# Patient Record
Sex: Male | Born: 1960 | Race: White | Hispanic: No | Marital: Married
Health system: Southern US, Community
[De-identification: ages and names within clinical notes are randomized; demographics above are authoritative.]

---

## 1999-10-06 ENCOUNTER — Encounter: Payer: Self-pay | Admitting: Emergency Medicine

## 1999-10-06 ENCOUNTER — Emergency Department (HOSPITAL_COMMUNITY): Admission: EM | Admit: 1999-10-06 | Discharge: 1999-10-06 | Payer: Self-pay | Admitting: Emergency Medicine

## 2002-06-10 ENCOUNTER — Encounter: Payer: Self-pay | Admitting: Family Medicine

## 2002-06-10 ENCOUNTER — Encounter: Admission: RE | Admit: 2002-06-10 | Discharge: 2002-06-10 | Payer: Self-pay | Admitting: Family Medicine

## 2012-03-01 ENCOUNTER — Other Ambulatory Visit: Payer: Self-pay | Admitting: Family Medicine

## 2012-03-01 DIAGNOSIS — R109 Unspecified abdominal pain: Secondary | ICD-10-CM

## 2012-03-08 ENCOUNTER — Ambulatory Visit
Admission: RE | Admit: 2012-03-08 | Discharge: 2012-03-08 | Disposition: A | Discharge status: Home / Self Care | Payer: BC Managed Care – PPO | Source: Ambulatory Visit | Attending: Family Medicine | Admitting: Family Medicine

## 2012-03-08 DIAGNOSIS — R109 Unspecified abdominal pain: Secondary | ICD-10-CM

## 2013-07-05 IMAGING — US US ABDOMEN LIMITED
1 series · 14 of 25 positions shown · non-contrast
Comparison: No prior images available.  Ultrasound and CT report
from 10/06/1999 are reviewed.

CLINICAL DATA: Right upper quadrant abdominal pain.  Hypertension
and high cholesterol.

LIMITED ABDOMINAL ULTRASOUND - RIGHT UPPER QUADRANT

[Series 1: us abdomen limited · 0.30mm/px · 14 of 38 slices shown]
[im 1/38]
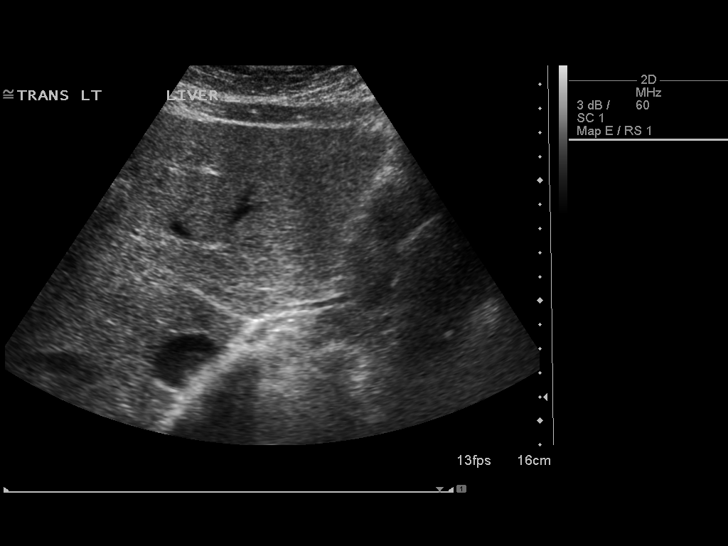
[im 4/38]
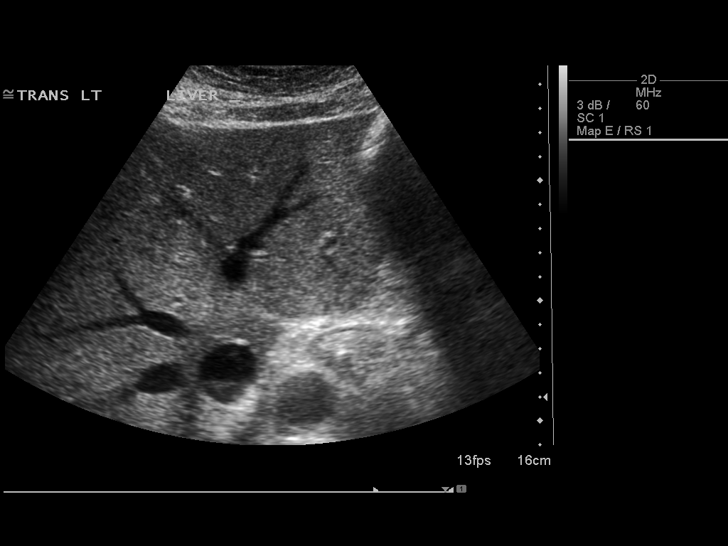
[im 7/38]
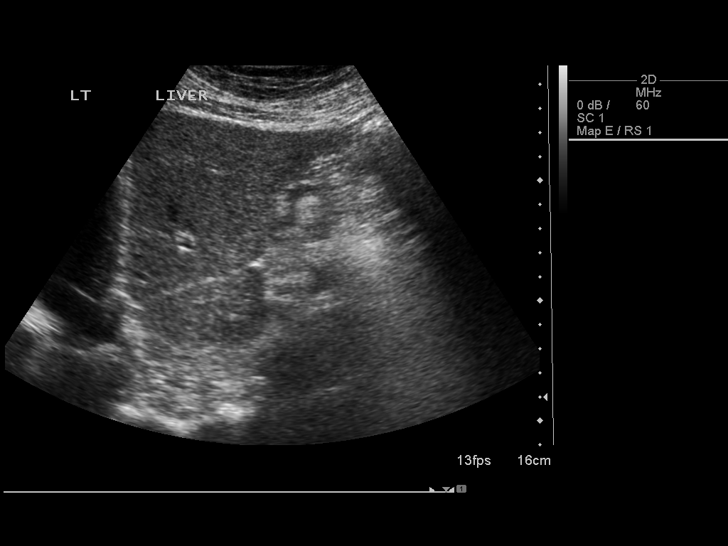
[im 10/38]
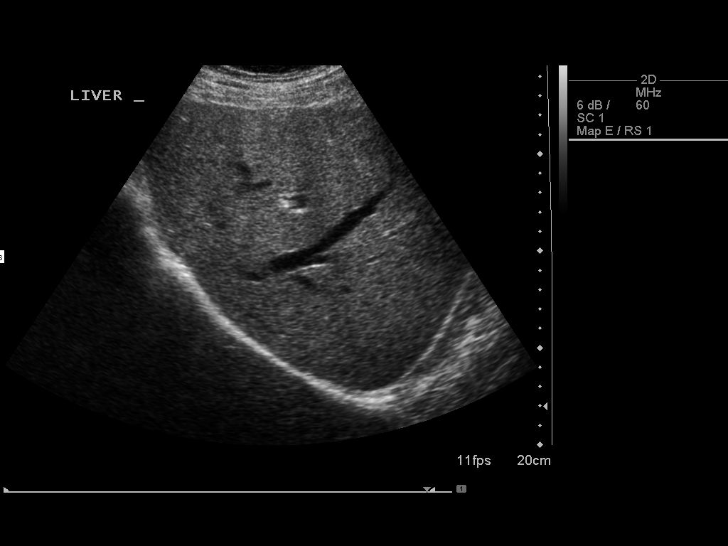
[im 13/38]
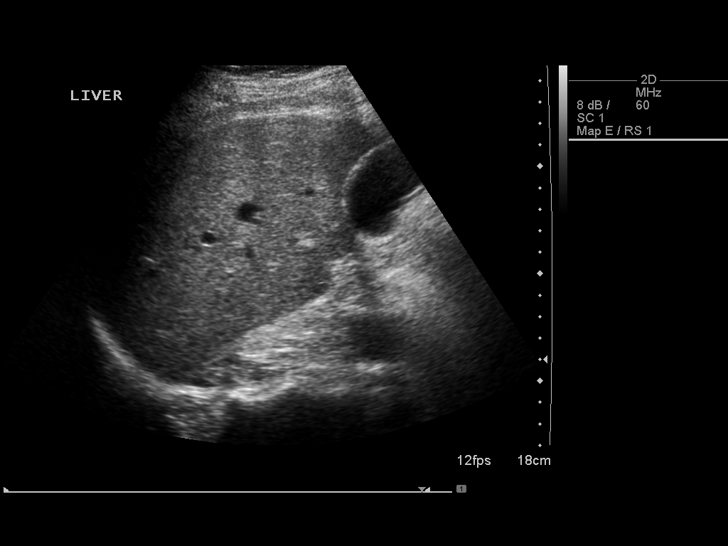
[im 14/38]
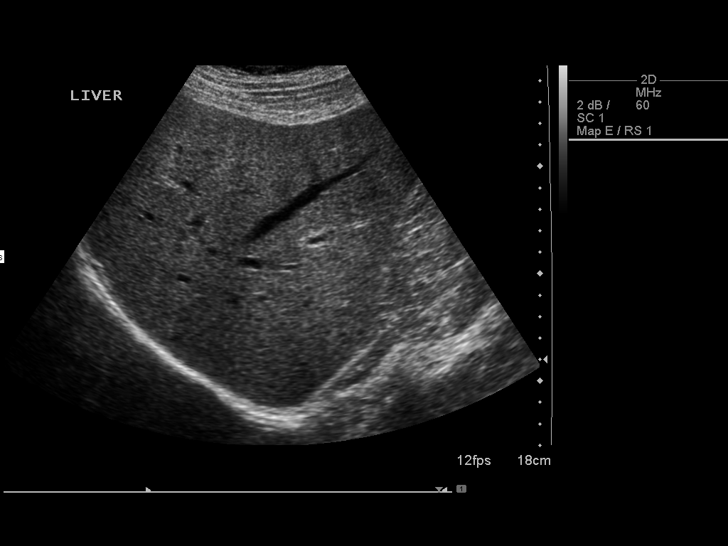
[im 17/38]
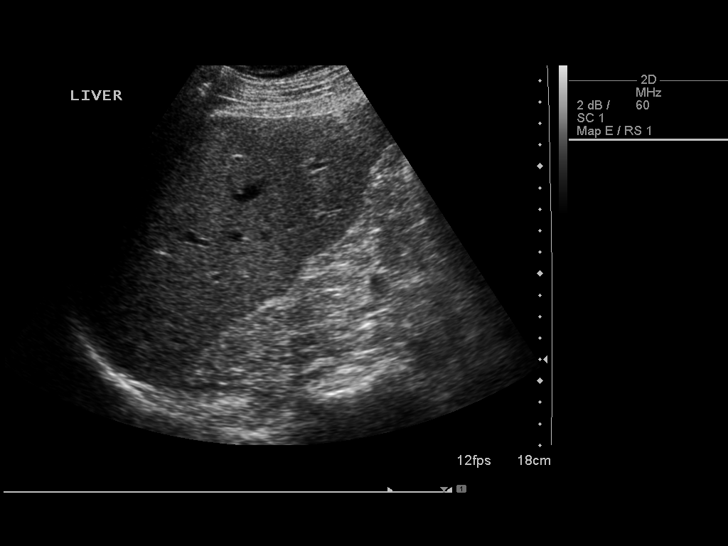
[im 21/38]
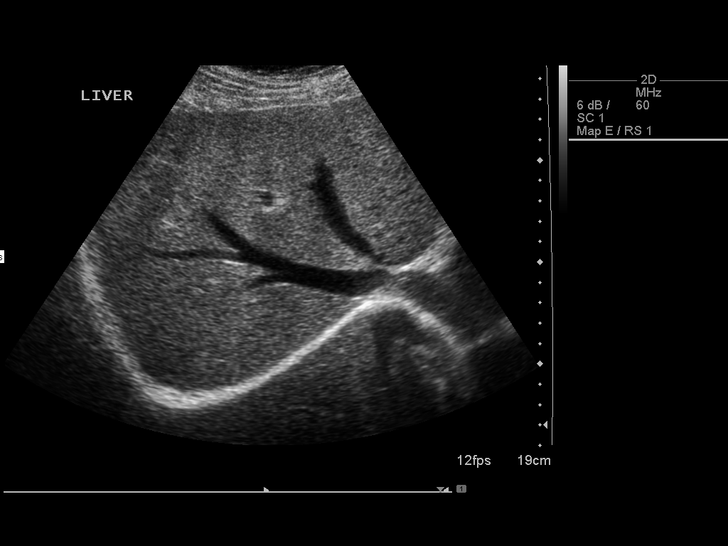
[im 24/38]
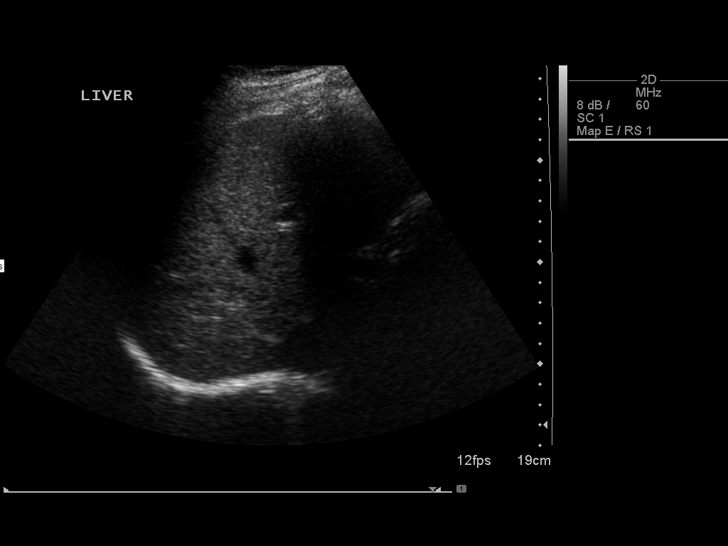
[im 25/38]
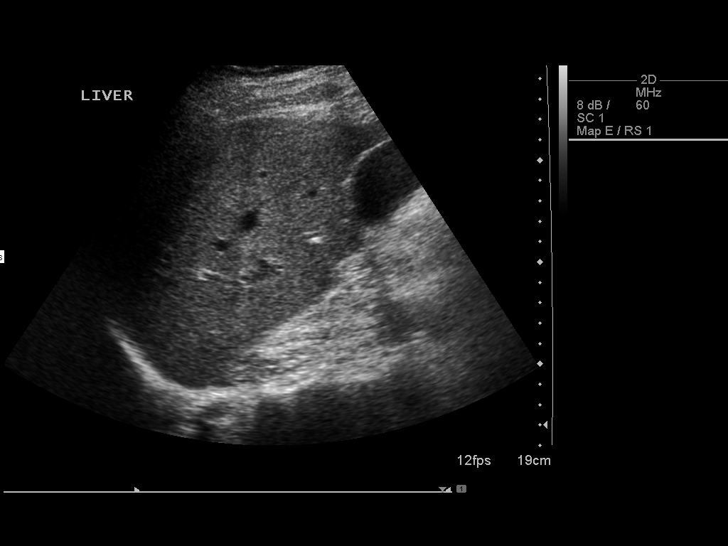
[im 28/38]
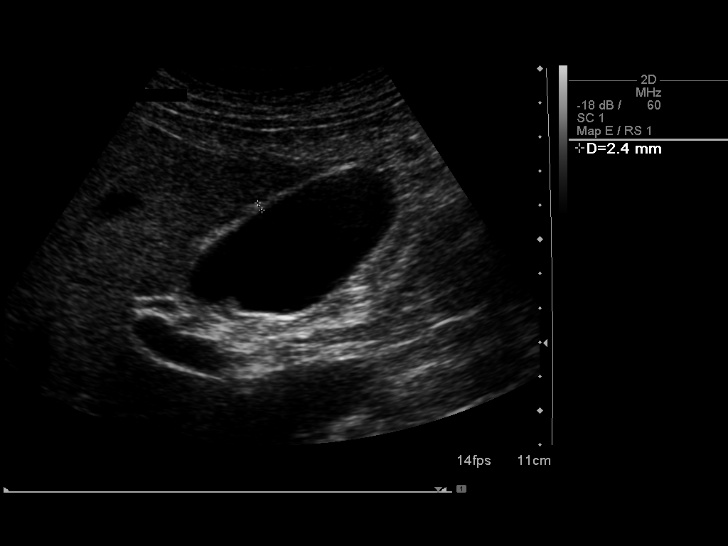
[im 31/38]
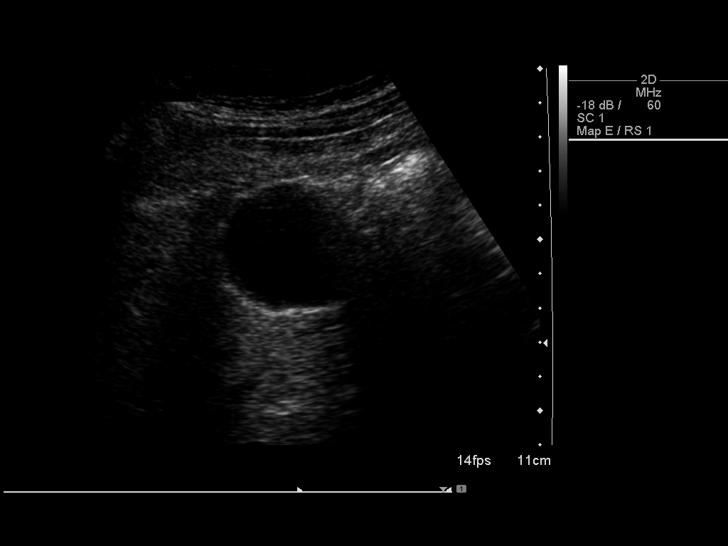
[im 34/38]
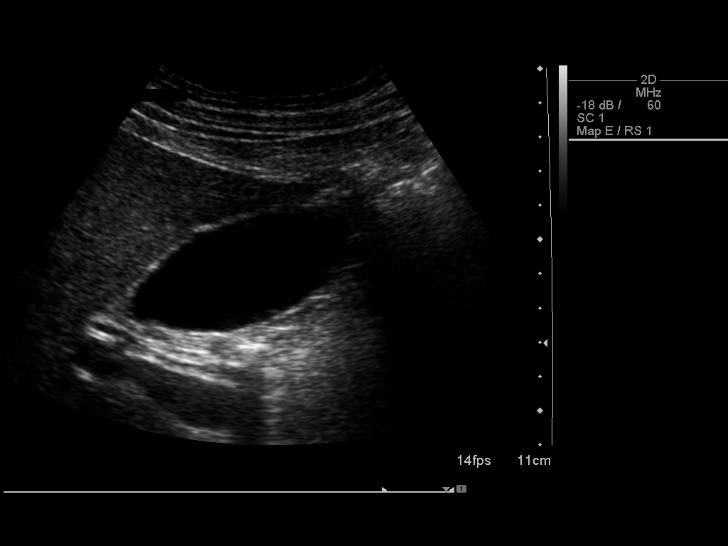
[im 38/38]
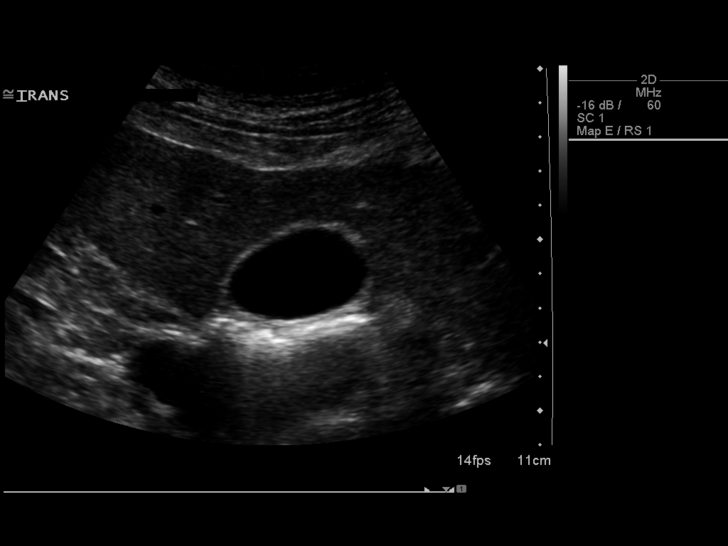

[14 of 25 positions shown; findings below may reference images not displayed]

FINDINGS: Gallbladder:  Normal, without wall thickening, stone, or
pericholecystic fluid.  Sonographic Murphy's sign was not elicited.

Common bile duct:  Normal, 5 mm.

Liver:  Normal in echogenicity, without focal lesion.
IMPRESSION: No acute process or explanation for right upper quadrant pain.

## 2013-08-30 ENCOUNTER — Other Ambulatory Visit: Payer: Self-pay | Admitting: Internal Medicine

## 2013-08-30 DIAGNOSIS — R319 Hematuria, unspecified: Secondary | ICD-10-CM

## 2013-09-02 ENCOUNTER — Other Ambulatory Visit: Payer: BC Managed Care – PPO

## 2013-09-06 ENCOUNTER — Ambulatory Visit
Admission: RE | Admit: 2013-09-06 | Discharge: 2013-09-06 | Disposition: A | Discharge status: Home / Self Care | Payer: BC Managed Care – PPO | Source: Ambulatory Visit | Attending: Internal Medicine | Admitting: Internal Medicine

## 2013-09-06 DIAGNOSIS — R319 Hematuria, unspecified: Secondary | ICD-10-CM

## 2015-01-03 IMAGING — CT CT ABD-PELV W/O CM
2 of 4 series · 13 of 36 positions shown, 18 images · non-contrast
Comparison: Right upper quadrant ultrasound 03/08/2012. Report of
the CT Abdomen and Pelvis 10/06/1999 (no images available).

CLINICAL DATA: 52-year-old male with hematuria and right flank
pain. Initial encounter.

EXAM:
CT ABDOMEN AND PELVIS WITHOUT CONTRAST
TECHNIQUE: Multidetector CT imaging of the abdomen and pelvis was performed
following the standard protocol without intravenous contrast.

[Series 601: coronal body · coronal · 0.92mm/px · 1 of 126 slices shown, 2 images]
[im 42/126  soft-tissue]
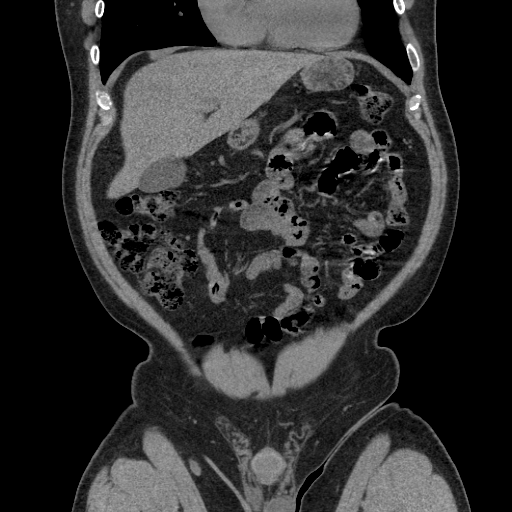
[im 42/126  bone]
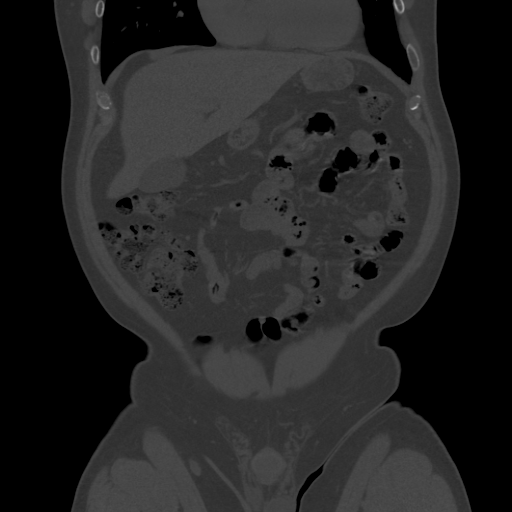

[Series 602: sagittal body · sagittal · 0.92mm/px · 12 of 163 slices shown, 16 images]
[im 10/163  soft-tissue]
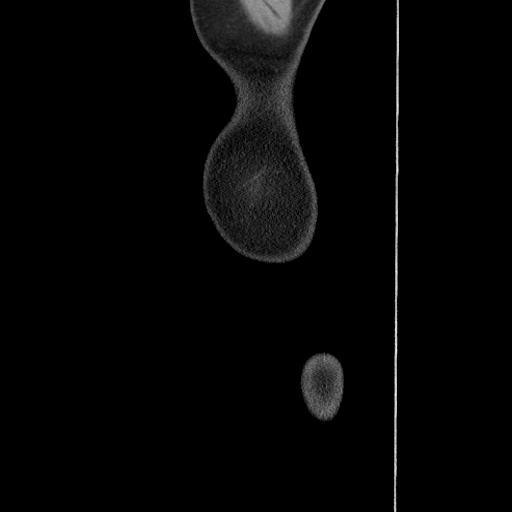
[im 10/163  lung]
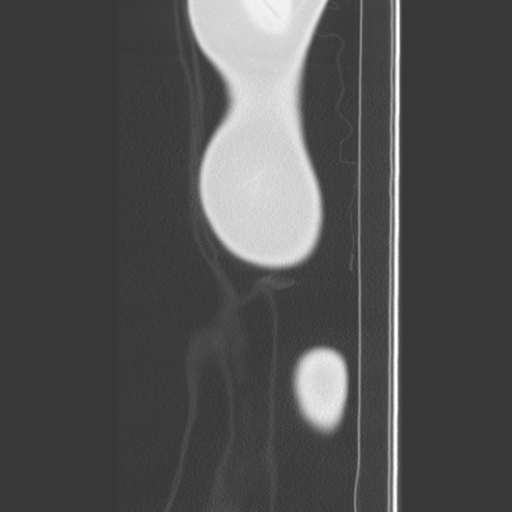
[im 10/163  bone]
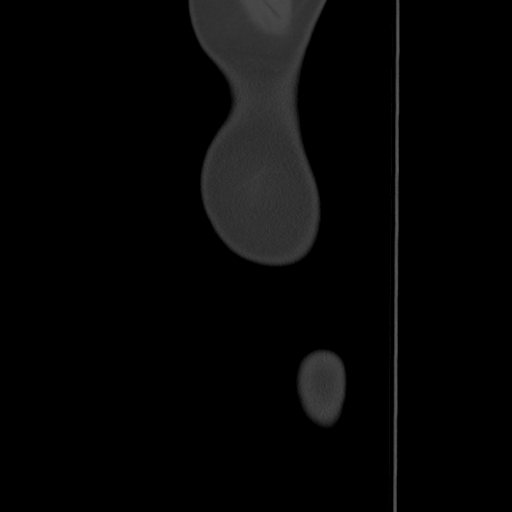
[im 19/163  lung]
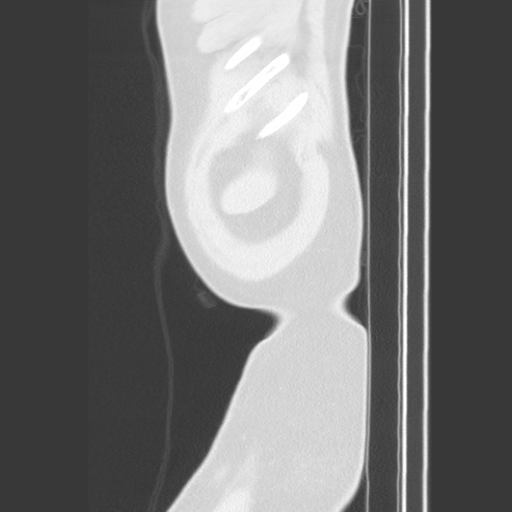
[im 28/163  soft-tissue]
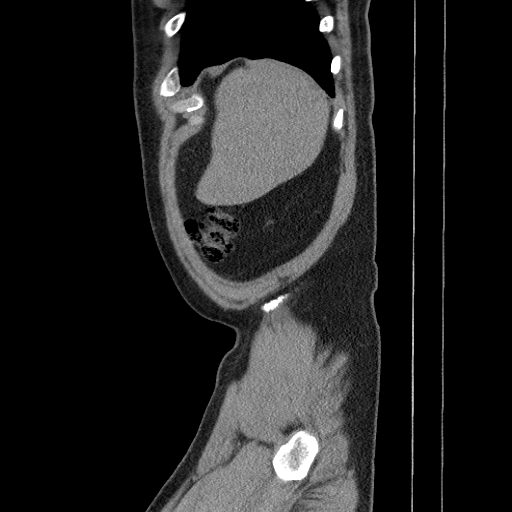
[im 28/163  lung]
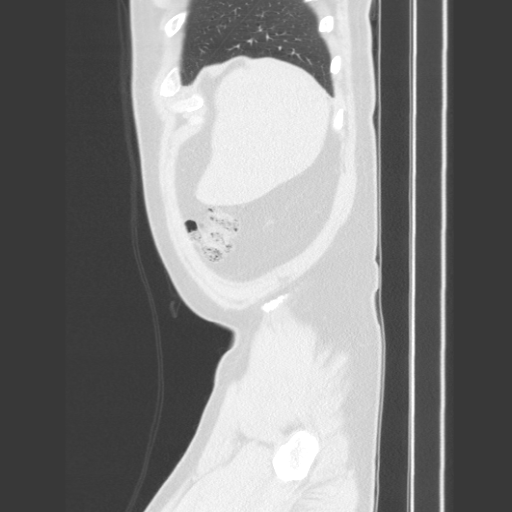
[im 37/163  lung]
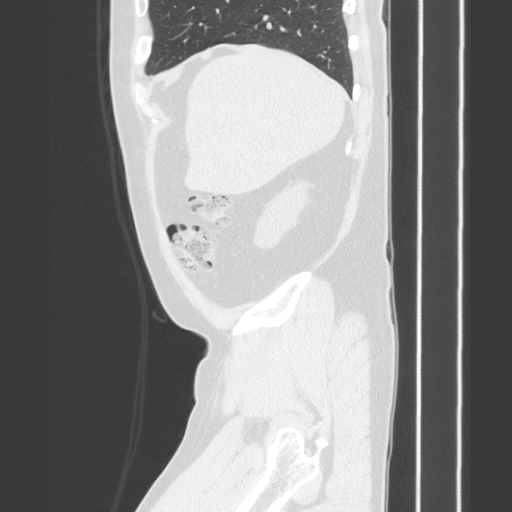
[im 46/163  soft-tissue]
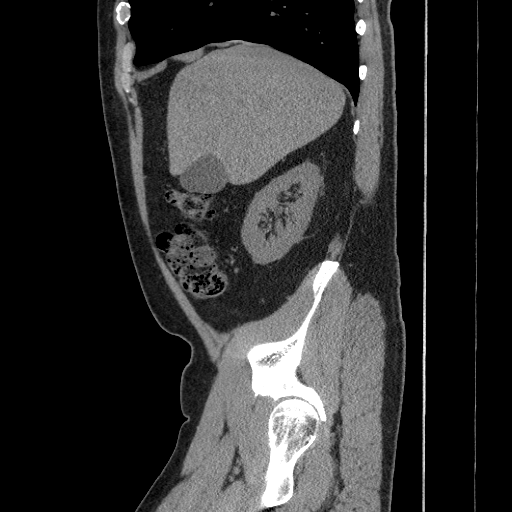
[im 55/163  soft-tissue]
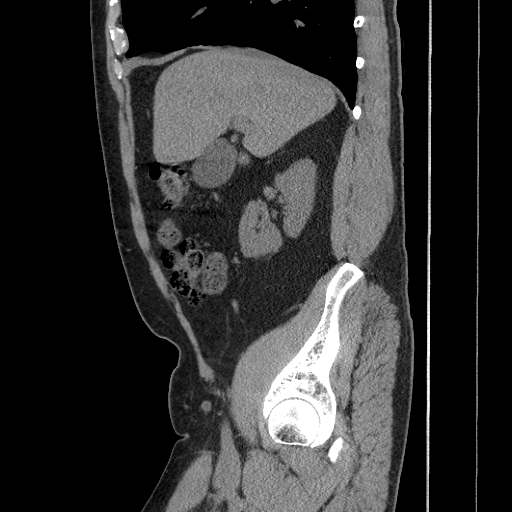
[im 73/163  soft-tissue]
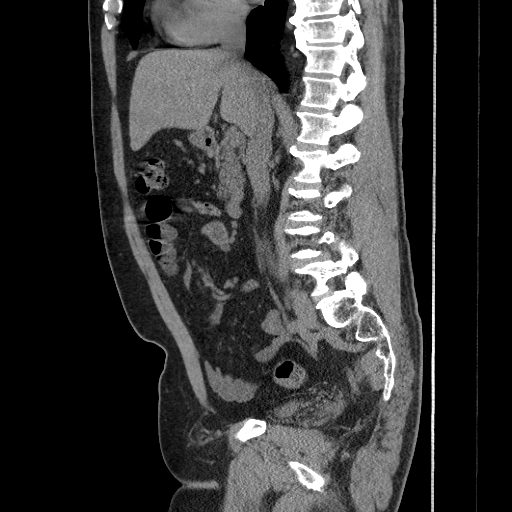
[im 91/163  soft-tissue]
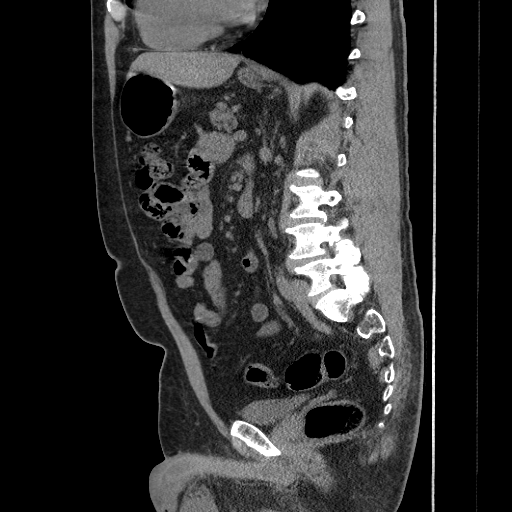
[im 109/163  soft-tissue]
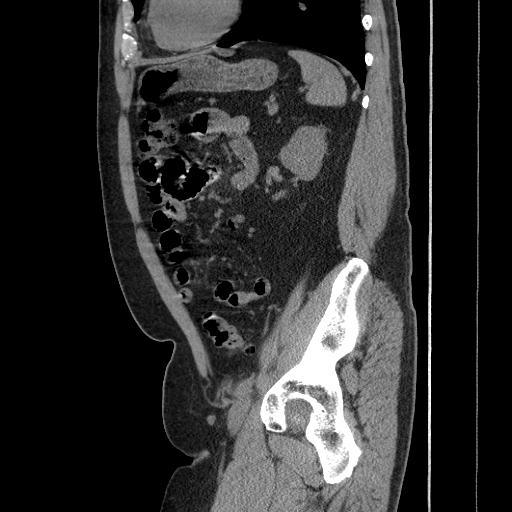
[im 118/163  soft-tissue]
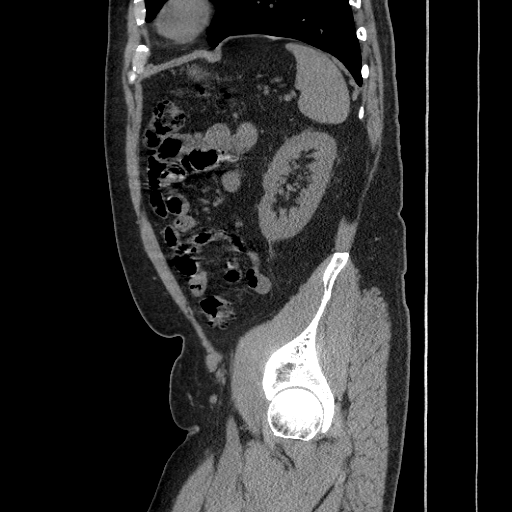
[im 136/163  soft-tissue]
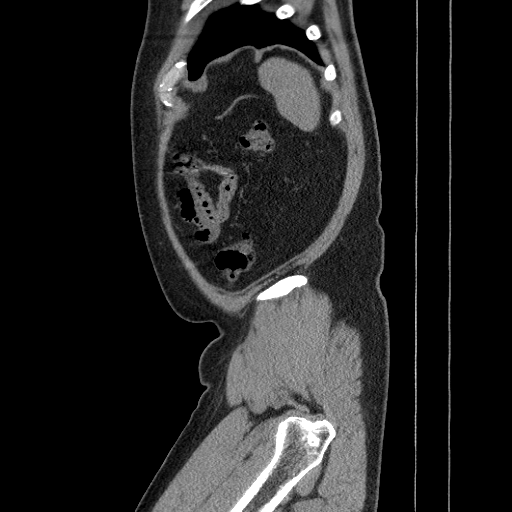
[im 136/163  bone]
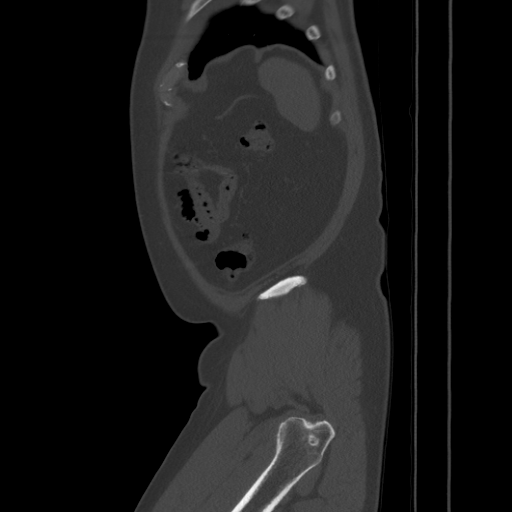
[im 154/163  soft-tissue]
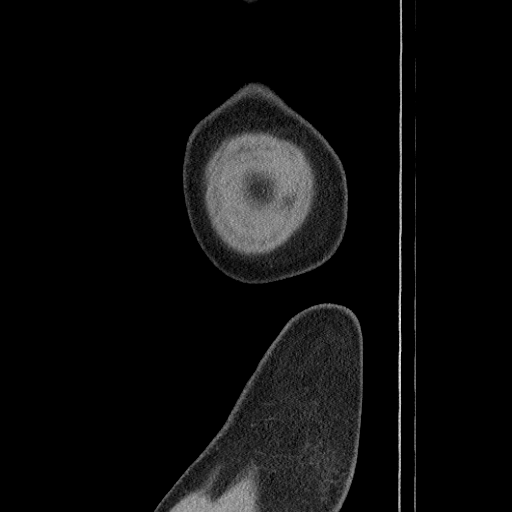

[13 of 36 positions shown; findings below may reference images not displayed]

FINDINGS: No pericardial or pleural effusion.  Negative lung bases.

Lower lumbar disc, endplate, and facet degeneration. Severe facet
arthropathy at L4-L5, with left side vacuum facet phenomena. No
acute osseous abnormality identified.

No pelvic free fluid. Negative rectum. Negative distal sigmoid
colon. Moderate diverticulosis of the proximal sigmoid. Negative
left colon and transverse colon. Negative right colon and appendix.
Negative terminal ileum. No dilated small bowel. Largely
decompressed stomach and duodenum.

Negative non contrast liver, spleen, pancreas and adrenal glands.

Small depending cholelithiasis. No pericholecystic fluid. Mild
haziness at the root of the small bowel mesenteries.

Negative non contrast left kidney and left ureter.

Mildly asymmetrically increased right perinephric stranding. No
dilatation of the right renal collecting system. No nephrolithiasis.
The proximal right ureter is normal. No definite dilatation of the
distal right ureter, but just inside the right aspect of the bladder
there is an angular 6 x 4 mm calculus. This projects very near the
right ureterovesical junction. The bladder is fairly decompressed
and otherwise unremarkable.

No abdominal free fluid. Mild Aortoiliac calcified atherosclerosis
noted. No lymphadenopathy identified.
IMPRESSION: 1. Urologic calculus, 6 x 4 mm, just inside the bladder near the
right UVJ, compatible with very recently passed ureteral calculus.
Mildly asymmetric right perinephric stranding, no hydronephrosis or
hydroureter.
2. No other urologic calculus identified.
3. Cholelithiasis. Sigmoid diverticulosis. Lower lumbar disc and
facet degeneration.

## 2015-08-06 ENCOUNTER — Other Ambulatory Visit: Payer: Self-pay | Admitting: Family Medicine

## 2015-08-06 DIAGNOSIS — E049 Nontoxic goiter, unspecified: Secondary | ICD-10-CM

## 2015-08-10 ENCOUNTER — Ambulatory Visit
Admission: RE | Admit: 2015-08-10 | Discharge: 2015-08-10 | Disposition: A | Discharge status: Home / Self Care | Payer: BLUE CROSS/BLUE SHIELD | Source: Ambulatory Visit | Attending: Family Medicine | Admitting: Family Medicine

## 2015-08-10 DIAGNOSIS — E049 Nontoxic goiter, unspecified: Secondary | ICD-10-CM

## 2015-08-15 ENCOUNTER — Other Ambulatory Visit: Payer: Self-pay | Admitting: Family Medicine

## 2015-08-15 DIAGNOSIS — E041 Nontoxic single thyroid nodule: Secondary | ICD-10-CM

## 2015-08-23 ENCOUNTER — Ambulatory Visit
Admission: RE | Admit: 2015-08-23 | Discharge: 2015-08-23 | Disposition: A | Discharge status: Home / Self Care | Payer: BLUE CROSS/BLUE SHIELD | Source: Ambulatory Visit | Attending: Family Medicine | Admitting: Family Medicine

## 2015-08-23 ENCOUNTER — Other Ambulatory Visit (HOSPITAL_COMMUNITY)
Admission: RE | Admit: 2015-08-23 | Discharge: 2015-08-23 | Disposition: A | Discharge status: Home / Self Care | Payer: BLUE CROSS/BLUE SHIELD | Source: Ambulatory Visit | Attending: Diagnostic Radiology | Admitting: Diagnostic Radiology

## 2015-08-23 DIAGNOSIS — E041 Nontoxic single thyroid nodule: Secondary | ICD-10-CM | POA: Insufficient documentation

## 2016-12-06 IMAGING — US US SOFT TISSUE HEAD/NECK
1 series · 13 of 25 positions shown · non-contrast
Comparison: CT 04/16/2007

CLINICAL DATA: Goiter

EXAM:
THYROID ULTRASOUND
TECHNIQUE: Ultrasound examination of the thyroid gland and adjacent soft
tissues was performed.

[Series 1: us soft tissue head/neck · 0.11mm/px · 13 of 69 slices shown]
[im 1/69]
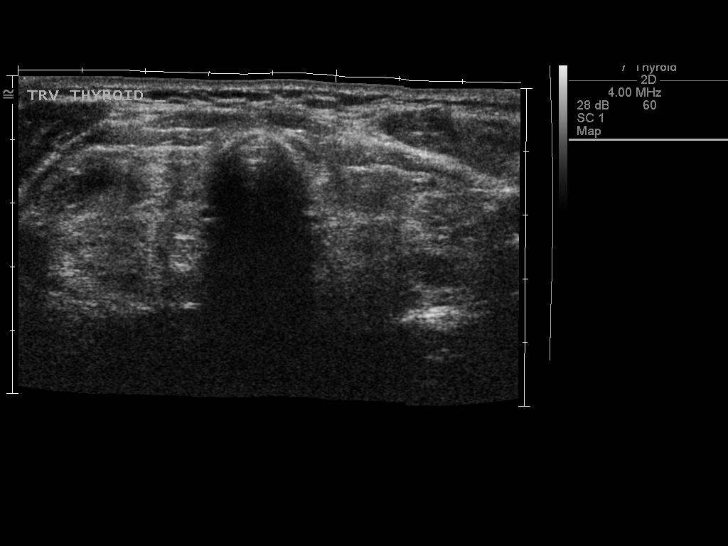
[im 6/69]
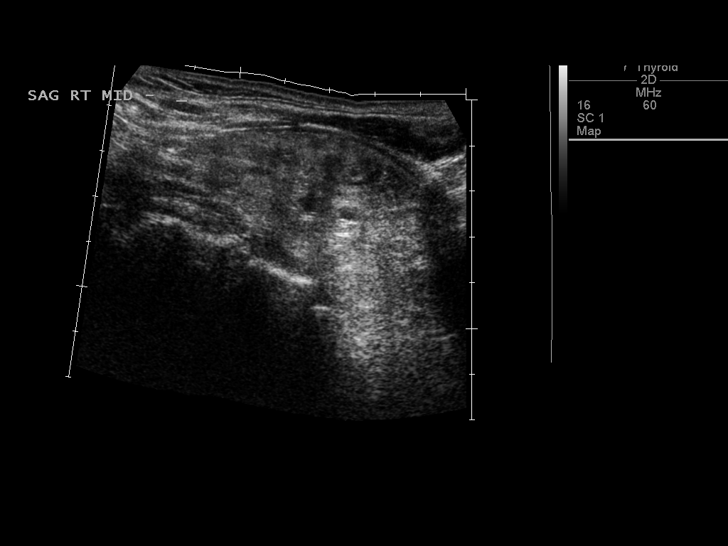
[im 12/69]
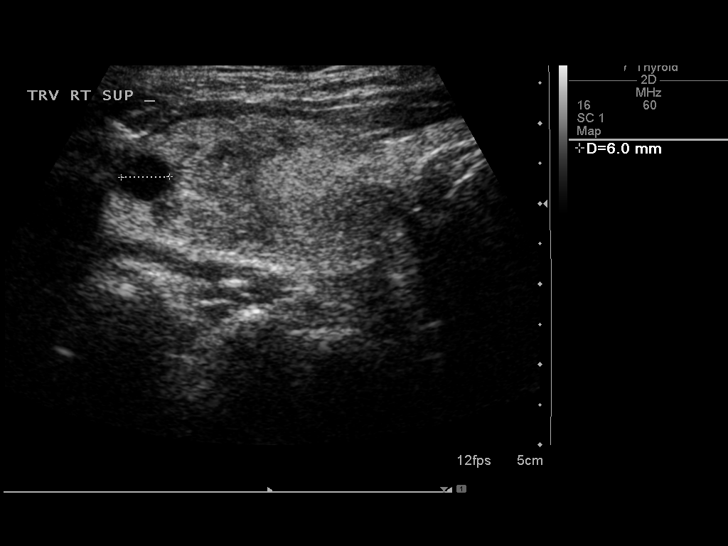
[im 18/69]
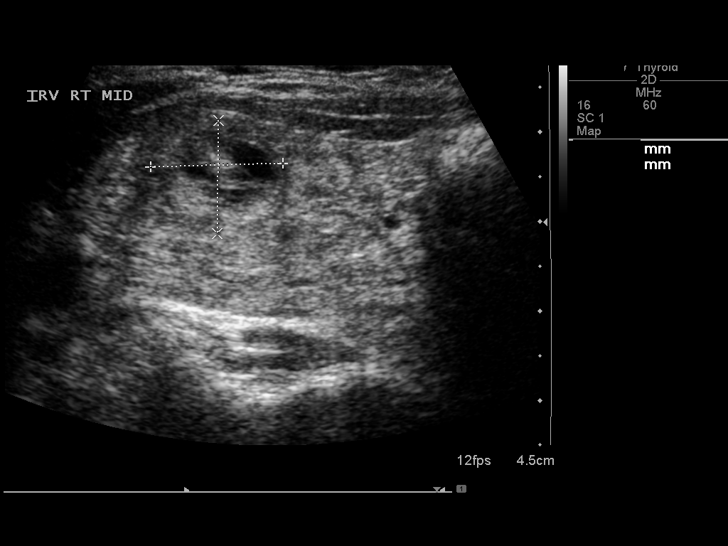
[im 23/69]
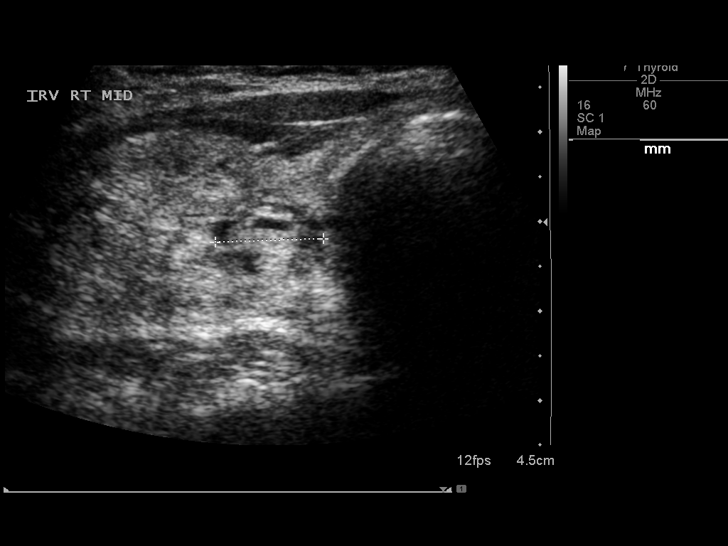
[im 29/69]
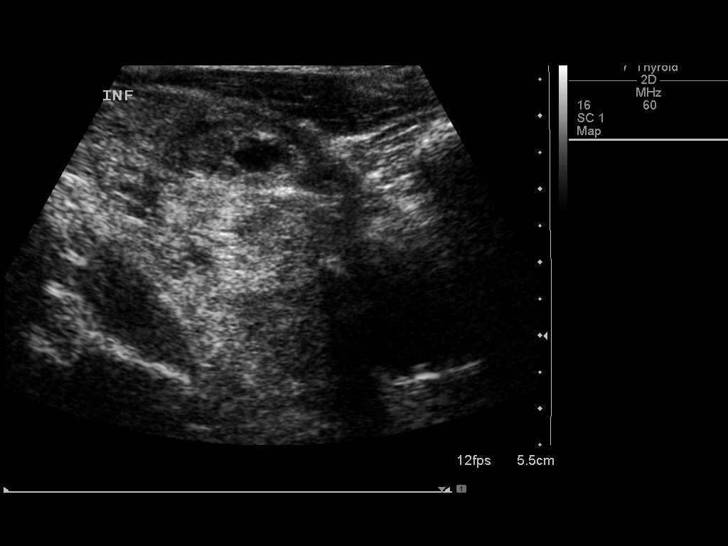
[im 35/69]
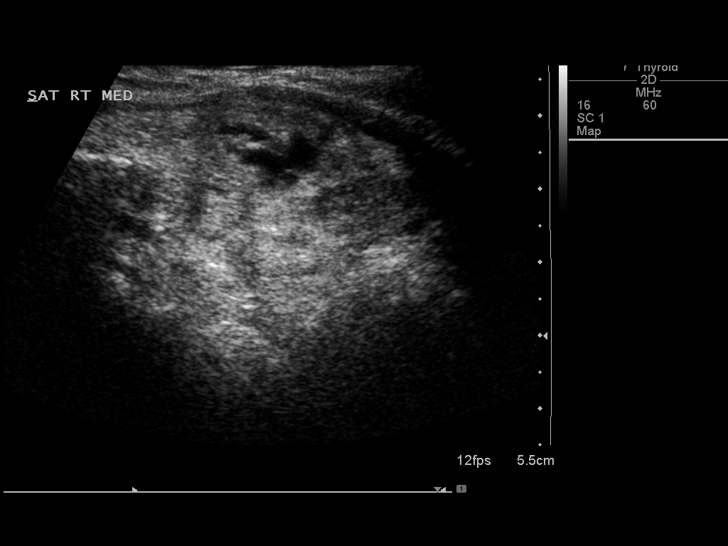
[im 40/69]
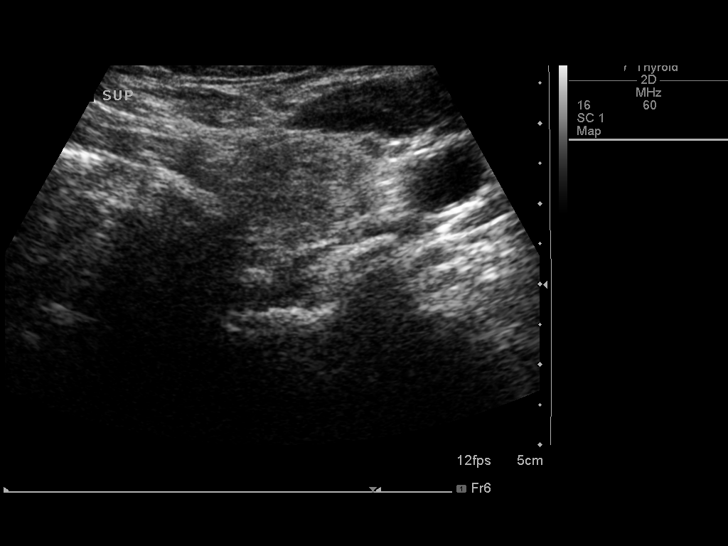
[im 46/69]
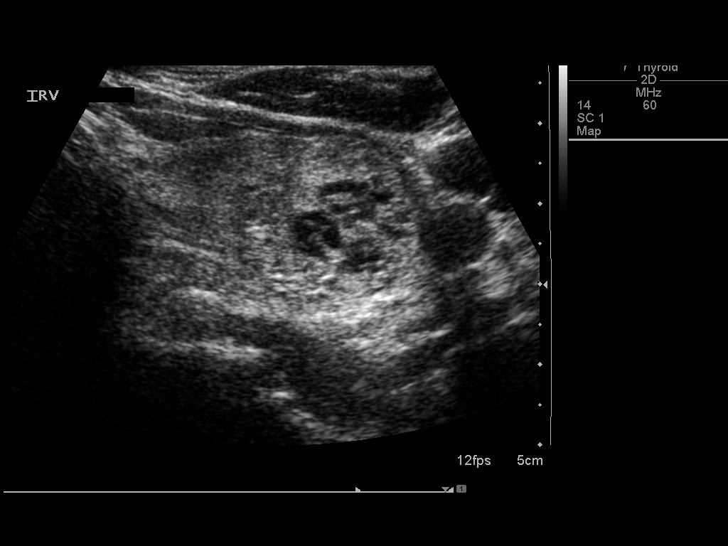
[im 52/69]
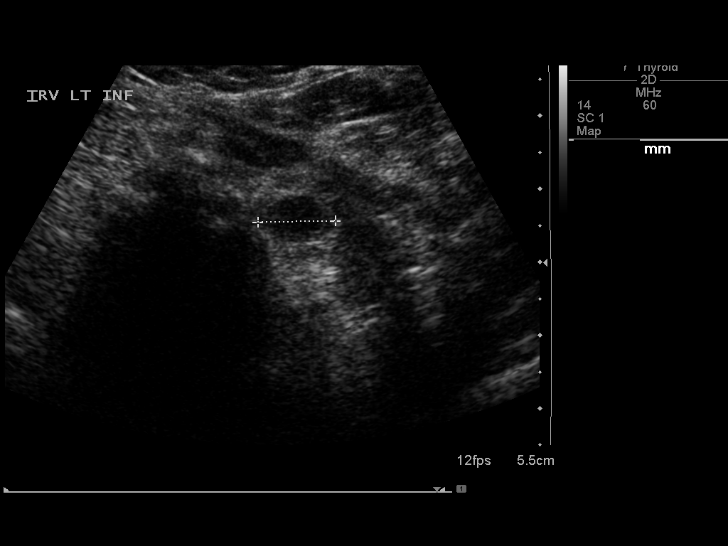
[im 57/69]
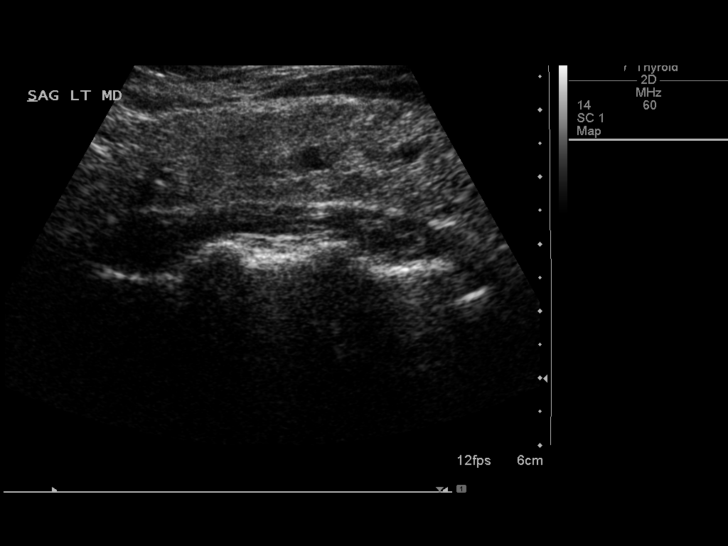
[im 63/69]
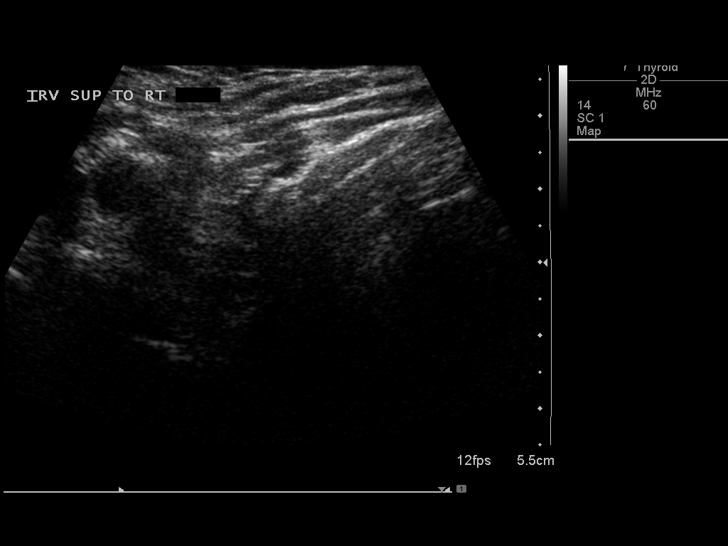
[im 69/69]
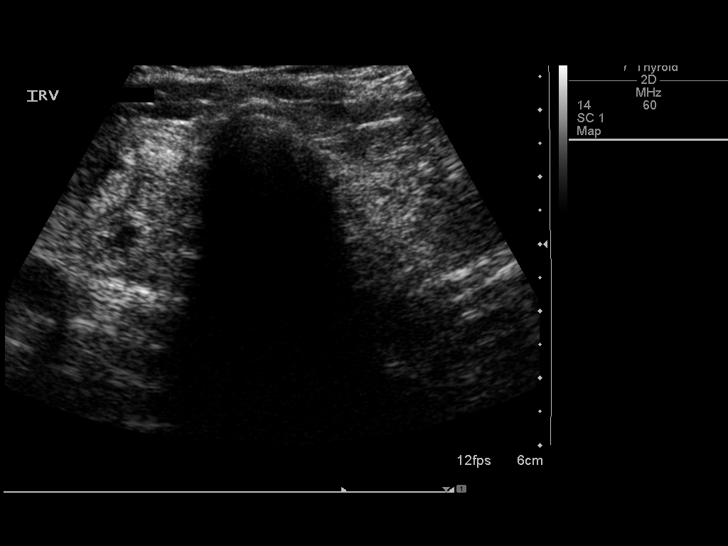

[13 of 25 positions shown; findings below may reference images not displayed]

FINDINGS: Right thyroid lobe

Measurements: 81 x 30 x 32 mm. Heterogeneous echotexture. Multiple
small solid and complex nodules. Dominant Complex 15 x 13 x27 mm,
mid lobe.14 x 15 x 16 mm in the inferior pole. Complex 12 mm mostly
solid nodule medially, mid lobe. 12 x 12 x 11 mm complex mostly
solid nodule, laterally in the mid lobe. Remainder less than 1 cm.

Left thyroid lobe

Measurements: 83 x 28 x 35 mm. Dominant Complex hyperemic 22 x 22 x
26 mm nodule, mid lobe. 14 x 8 x 19 mm mostly solid nodule, superior
pole. 11 mm hypoechoic nodule, inferior pole.

Isthmus

Thickness: 2.5 mm.  No nodules visualized.

Lymphadenopathy

None visualized.
IMPRESSION: 1. Thyromegaly with multiple bilateral nodules. Dominant bilateral
lesions meet consensus criteria for biopsy. Ultrasound-guided fine
needle aspiration should be considered, as per the consensus
statement: Management of Thyroid Nodules Detected at US: Society of
Radiologists in Ultrasound Consensus Conference Statement. Radiology

## 2016-12-19 IMAGING — US US THYROID BIOPSY
1 series · 12 of 12 positions shown · non-contrast
Comparison: none

INDICATION: 54-year-old with multinodular goiter. Plan to biopsy the dominant
bilateral lesions.

[Series 1: us thyroid biopsy · 12 acquisitions, 12 frames shown]
[im 1/12]
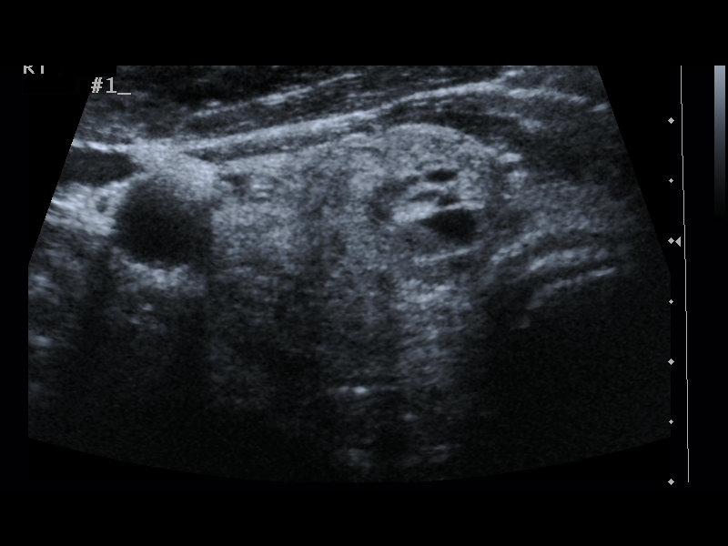
[im 2/12]
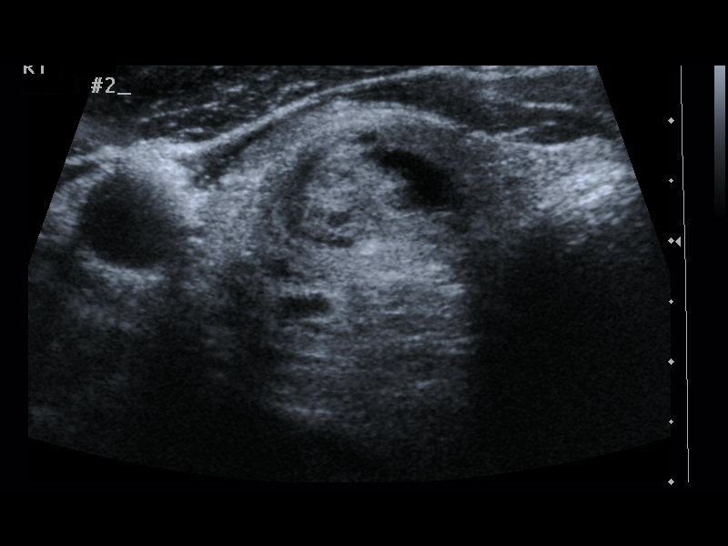
[im 3/12]
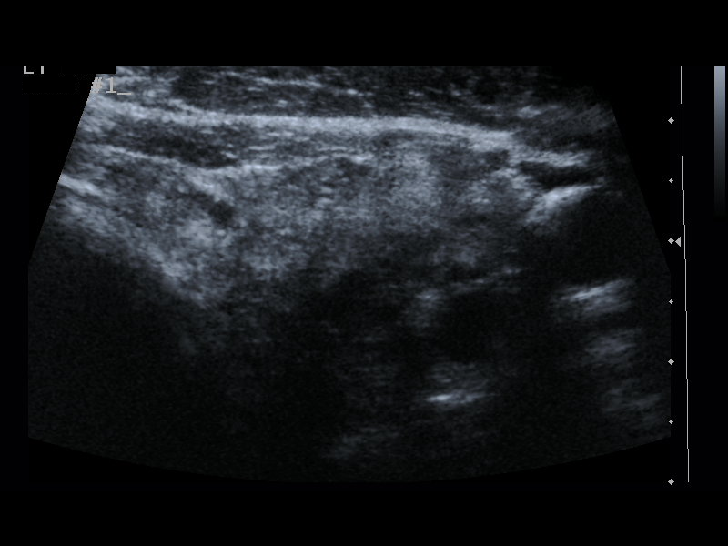
[im 4/12]
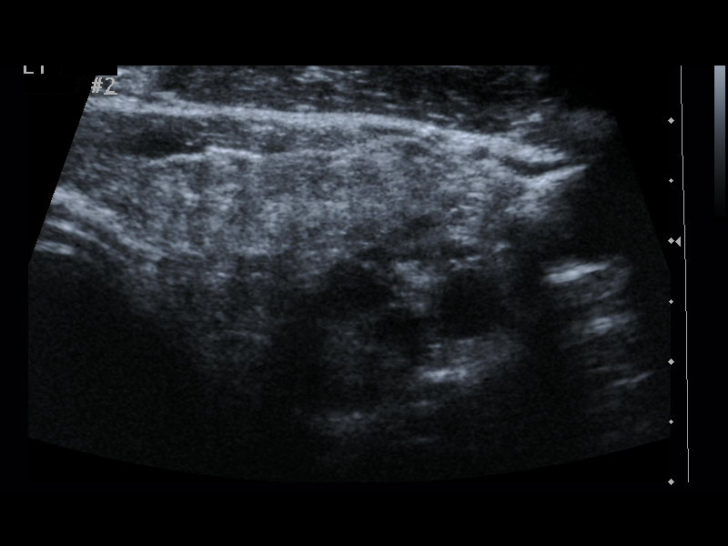
[im 5/12]
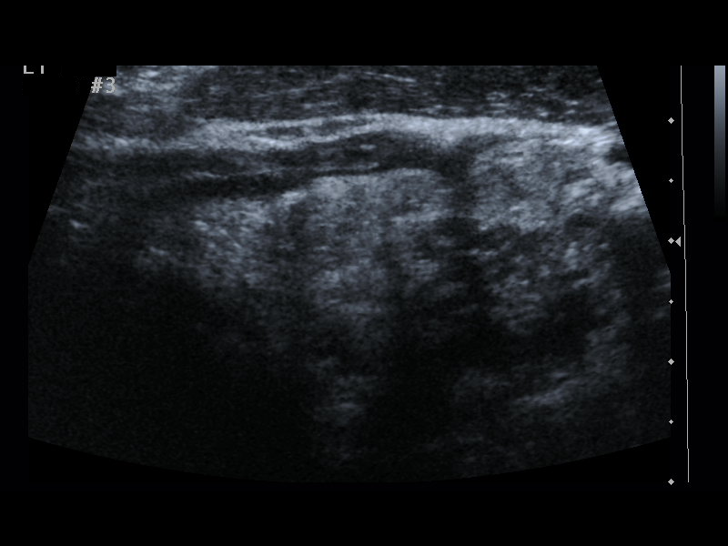
[im 6/12]
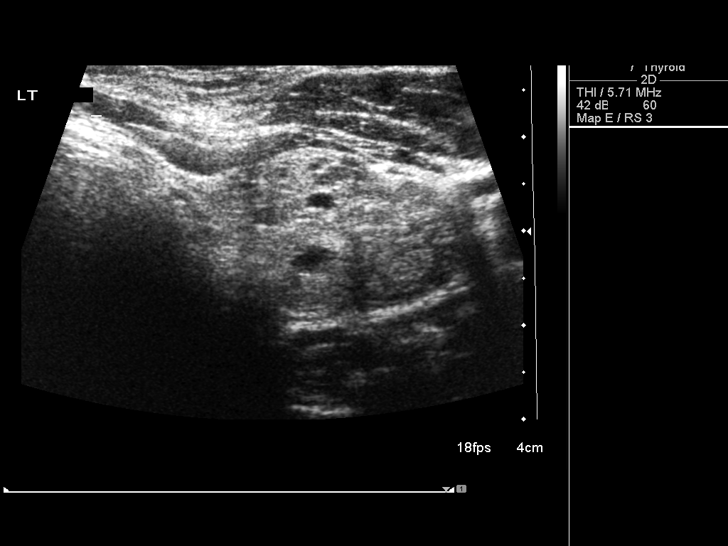
[im 7/12]
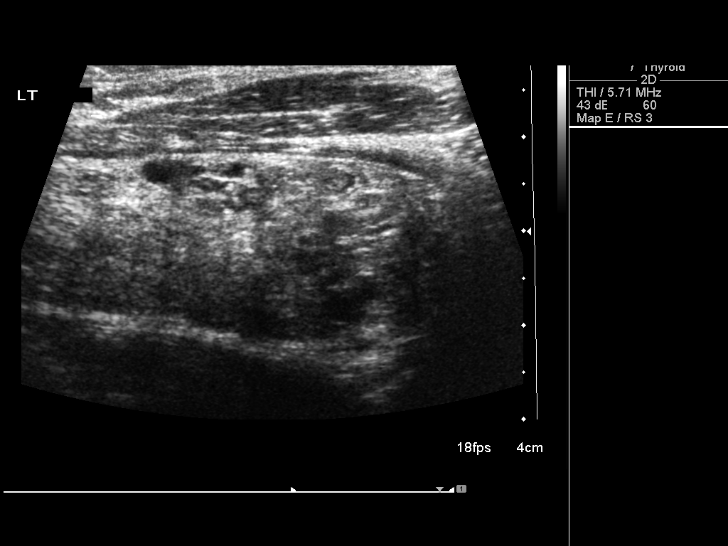
[im 8/12]
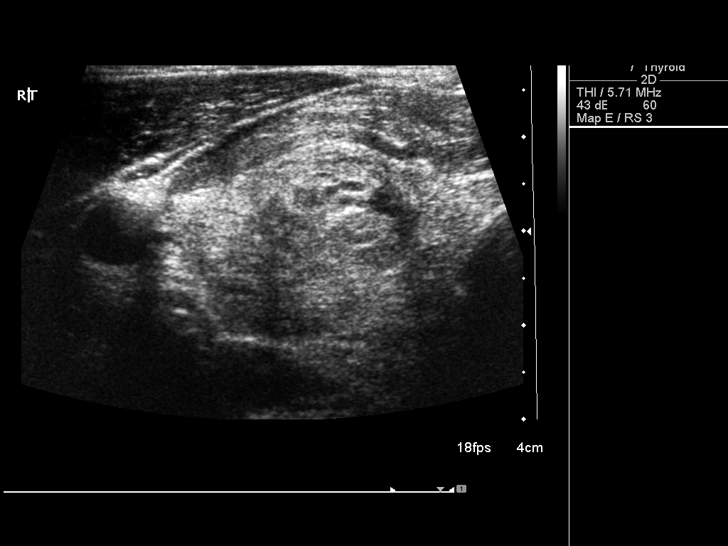
[im 9/12]
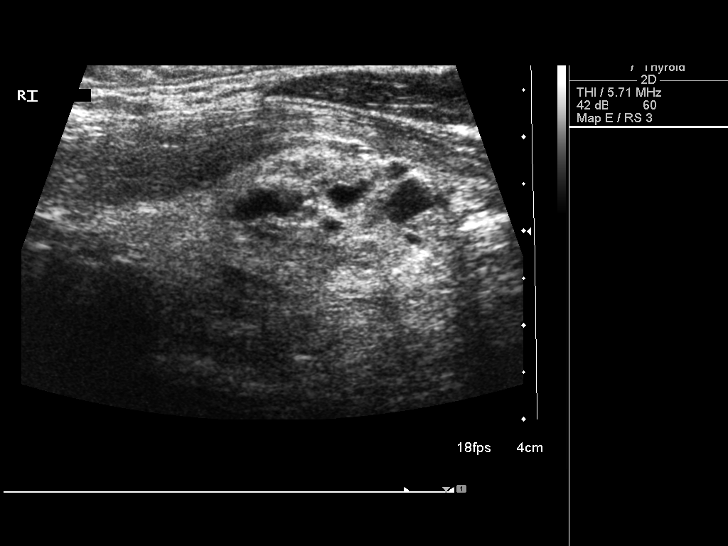
[im 10/12]
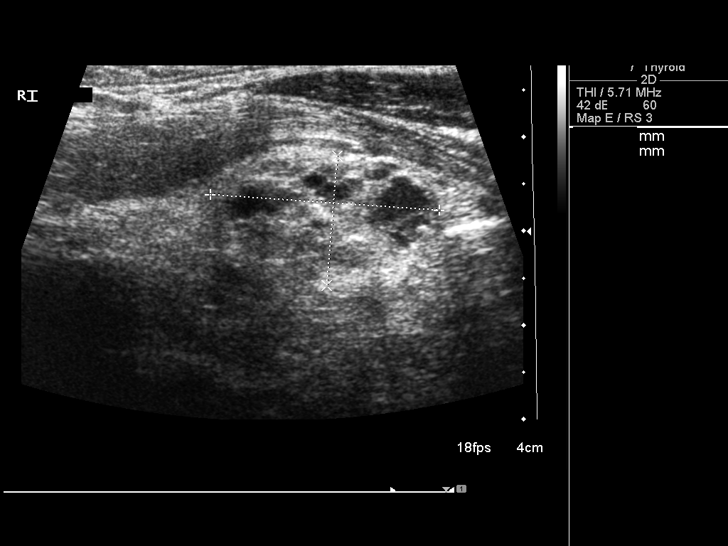
[im 11/12]
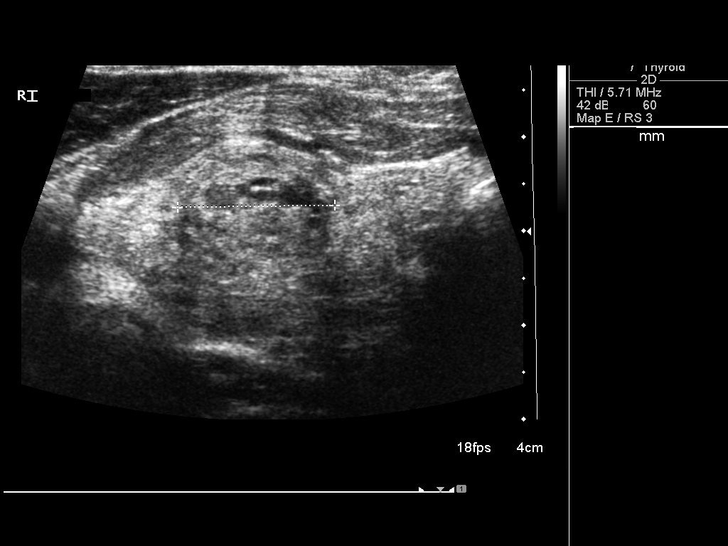
[im 12/12]
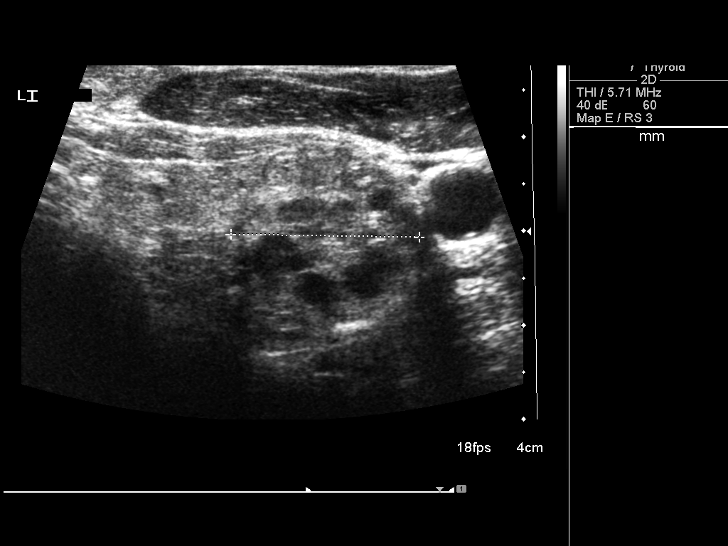

[12 of 12 positions shown; findings below may reference images not displayed]

EXAM:
ULTRASOUND GUIDED FINE NEEDLE ASPIRATION OF BILATERAL THYROID
NODULES

MEDICATIONS:
None.

ANESTHESIA/SEDATION:
None

COMPLICATIONS:
None immediate.

PROCEDURE:
The procedure was explained to the patient. The risks and benefits
of the procedure were discussed and the patient's questions were
addressed. Informed consent was obtained from the patient. The neck
was evaluated with ultrasound. The neck was prepped with Betadine
and a sterile drape was placed. The right side of the neck was
anesthetized with lidocaine. Three ultrasound guided fine needle
aspirations were obtained within the dominant thyroid nodule using
25 gauge needles. The left side of neck was anesthetized with 1%
lidocaine. Three ultrasound guided fine needle aspirations were
obtained in the dominant left thyroid nodule using 25 gauge needles.
Bandage placed over the puncture sites.
FINDINGS: Thyroid tissue is diffusely heterogeneous and nodular. It was
difficult to identify discrete nodules in either thyroid lobe. A
heterogeneous nodular area along the mid right thyroid lobe was
sampled and this appeared to correspond with the described dominant
lesion on the ultrasound for 08/09/2014. Heterogeneous lateral
nodular area in the mid left thyroid lobe was sampled.
IMPRESSION: Ultrasound guided biopsy of bilateral thyroid nodules.

Thyroid tissue is diffusely nodular and heterogeneous bilaterally.
Difficult to identify discrete nodules as described.

## 2018-07-22 ENCOUNTER — Other Ambulatory Visit: Payer: Self-pay

## 2018-07-22 ENCOUNTER — Emergency Department (HOSPITAL_COMMUNITY)
Admission: EM | Admit: 2018-07-22 | Discharge: 2018-07-22 | Disposition: A | Discharge status: Home / Self Care | Payer: BLUE CROSS/BLUE SHIELD | Attending: Emergency Medicine | Admitting: Emergency Medicine

## 2018-07-22 DIAGNOSIS — S65412A Laceration of blood vessel of left thumb, initial encounter: Secondary | ICD-10-CM | POA: Insufficient documentation

## 2018-07-22 DIAGNOSIS — Y990 Civilian activity done for income or pay: Secondary | ICD-10-CM | POA: Insufficient documentation

## 2018-07-22 DIAGNOSIS — W260XXA Contact with knife, initial encounter: Secondary | ICD-10-CM | POA: Insufficient documentation

## 2018-07-22 DIAGNOSIS — Y9289 Other specified places as the place of occurrence of the external cause: Secondary | ICD-10-CM | POA: Diagnosis not present

## 2018-07-22 DIAGNOSIS — Y9389 Activity, other specified: Secondary | ICD-10-CM | POA: Diagnosis not present

## 2018-07-22 MED ORDER — LIDOCAINE-EPINEPHRINE 2 %-1:200000 IJ SOLN
10.0000 mL | Freq: Once | INTRAMUSCULAR | Status: AC
Start: 2018-07-22 — End: 2018-07-22
  Administered 2018-07-22: 10 mL via INTRADERMAL
  Filled 2018-07-22: qty 20

## 2018-07-22 NOTE — Discharge Instructions (Signed)
Please have your sutures remove in 7 days.  Monitor for signs of infection.  Take tylenol or ibuprofen as needed for pain.

## 2018-07-22 NOTE — ED Provider Notes (Signed)
[facility]ENC[facility] Provider Note   CSN: [id number] Arrival date & time: 07/22/18  1433     History   Chief Complaint Chief Complaint  Patient presents with  . Thumb Injury     HPI Leon Davis is a 57 y.o. male.  The history is provided by the patient. No language interpreter was used.     57 year old male brought here via EMS from urgent care for evaluation of thumb injury.  Patient is a Product/process development scientistgeneral contractor, today he was trying to cut a towel with a razor when he accidentally sliced his left hand.  He reported immediate squirting of blood followed with sharp throbbing pain to the hand.  He believes the cut is between his thumb and his second finger.  He mainly wrapped it up, contact EMS, and was brought to urgent care.  Urgent care felt that this is an arterial bleed and recommend patient to come to the ER for evaluation.  Incident happened a few hours ago.  He is up-to-date with tetanus.  He denies any associated numbness.  He is right-hand dominant.  No past medical history on file.  There are no active problems to display for this patient.   The histories are not reviewed yet. Please review them in the "History" navigator section and refresh this SmartLink.      Home Medications    Prior to Admission medications   Not on File    Family History No family history on file.  Social History Social History   Tobacco Use  . Smoking status: Not on file  Substance Use Topics  . Alcohol use: Not on file  . Drug use: Not on file     Allergies   Patient has no allergy information on record.   Review of Systems Review of Systems  Constitutional: Negative for fever.  Skin: Positive for wound.  Neurological: Negative for numbness.     Physical Exam Updated Vital Signs BP (!) 179/89 (BP Location: Right Arm)   Pulse 92   Temp (!) 97.4 F (36.3 C) (Oral)   Resp 16   Ht 5\' 10"  (1.778 m)   Wt 88.5 kg   SpO2 98%   BMI 27.98  kg/m   Physical Exam Vitals signs and nursing note reviewed.  Constitutional:      General: He is not in acute distress.    Appearance: He is well-developed.  HENT:     Head: Atraumatic.  Eyes:     Conjunctiva/sclera: Conjunctivae normal.  Neck:     Musculoskeletal: Neck supple.  Musculoskeletal:        General: Signs of injury (L thumb: 2cm deep laceration noted to crease of thumb distal to IP joint with arterial involvement, sensation intact distally, FROM, brisk cap refill.  no fb noted. ) present.  Skin:    Findings: No rash.  Neurological:     Mental Status: He is alert.      ED Treatments / Results  Labs (all labs ordered are listed, but only abnormal results are displayed) Labs Reviewed - No data to display  EKG None  Radiology No results found.  Procedures .Marland Kitchen.Laceration Repair Date/Time: 07/22/2018 3:39 PM Performed by: Fayrene Helperran, Mykaela Arena, PA-C Authorized by: Fayrene Helperran, Werner Labella, PA-C   Consent:    Consent obtained:  Verbal   Consent given by:  Patient   Risks discussed:  Nerve damage, vascular damage and infection Anesthesia (see MAR for exact dosages):    Anesthesia method:  Nerve  block   Block needle gauge:  25 G   Block anesthetic:  Lidocaine 2% WITH epi   Block technique:  Digital nerve block   Block injection procedure:  Introduced needle and incremental injection   Block outcome:  Anesthesia achieved Laceration details:    Location:  Finger   Finger location:  L thumb   Length (cm):  2   Depth (mm):  5 Repair type:    Repair type:  Intermediate Pre-procedure details:    Preparation:  Patient was prepped and draped in usual sterile fashion Exploration:    Hemostasis achieved with:  Tourniquet   Wound exploration: wound explored through full range of motion and entire depth of wound probed and visualized     Wound extent: vascular damage     Wound extent: no muscle damage noted, no nerve damage noted, no tendon damage noted and no underlying fracture  noted     Contaminated: no   Treatment:    Area cleansed with:  Saline   Amount of cleaning:  Standard   Irrigation solution:  Sterile saline   Visualized foreign bodies/material removed: no   Skin repair:    Repair method:  Sutures   Suture material:  Prolene   Suture technique:  Simple interrupted   Number of sutures:  4 Approximation:    Approximation:  Loose Post-procedure details:    Dressing:  Bulky dressing   Patient tolerance of procedure:  Tolerated well, no immediate complications   (including critical care time)  Medications Ordered in ED Medications  lidocaine-EPINEPHrine (XYLOCAINE W/EPI) 2 %-1:200000 (PF) injection 10 mL (10 mLs Intradermal Given 07/22/18 1539)     Initial Impression / Assessment and Plan / ED Course  I have reviewed the triage vital signs and the nursing notes.  Pertinent labs & imaging results that were available during my care of the patient were reviewed by me and considered in my medical decision making (see chart for details).     BP (!) 142/85   Pulse 70   Temp (!) 97.4 F (36.3 C) (Oral)   Resp 16   Ht 5\' 10"  (1.778 m)   Wt 88.5 kg   SpO2 96%   BMI 27.98 kg/m    Final Clinical Impressions(s) / ED Diagnoses   Final diagnoses:  Laceration of blood vessel of left thumb, initial encounter    ED Discharge Orders    None      3:40 PM Pt here with L thumb injury.  He suffered a deep lac to thumb with arterial bleed.  I was able to use tactical tourniquet to achieve hemostasis.  Wound were explored, irrigated, and sutured placed.  Pt tolerates well.  Wound were dressed by nurse.  Pt d/c home with recommendation to return in 7 days for sutures removal.  Pt made aware of sign of infection to return.  Pt understand risk of rebleed.    Fayrene Helperran, Suzanne Garbers, PA-C 07/22/18 1638    Tilden Fossaees, Elizabeth, MD 07/24/18 [phone]

## 2018-07-22 NOTE — ED Triage Notes (Signed)
Pt BIB by [facility]. Pt was sent here from urgent care. Pt was at work when he sliced his left thumb with a razor. Pt went to urgent care and they stated that the patient had an arterial bleed and recommended he come here. Pt's left hand is wrapped at this time. Pt reports 8/10 throbbing pain in his left thumb.

## 2018-09-12 ENCOUNTER — Emergency Department (HOSPITAL_COMMUNITY)
Admission: EM | Admit: 2018-09-12 | Discharge: 2018-09-12 | Disposition: A | Discharge status: Home / Self Care | Payer: BLUE CROSS/BLUE SHIELD | Attending: Emergency Medicine | Admitting: Emergency Medicine

## 2018-09-12 ENCOUNTER — Emergency Department (HOSPITAL_COMMUNITY): Payer: BLUE CROSS/BLUE SHIELD

## 2018-09-12 ENCOUNTER — Other Ambulatory Visit: Payer: Self-pay

## 2018-09-12 DIAGNOSIS — Z7982 Long term (current) use of aspirin: Secondary | ICD-10-CM | POA: Diagnosis not present

## 2018-09-12 DIAGNOSIS — Y929 Unspecified place or not applicable: Secondary | ICD-10-CM | POA: Insufficient documentation

## 2018-09-12 DIAGNOSIS — Z23 Encounter for immunization: Secondary | ICD-10-CM | POA: Insufficient documentation

## 2018-09-12 DIAGNOSIS — W540XXA Bitten by dog, initial encounter: Secondary | ICD-10-CM | POA: Insufficient documentation

## 2018-09-12 DIAGNOSIS — W19XXXA Unspecified fall, initial encounter: Secondary | ICD-10-CM

## 2018-09-12 DIAGNOSIS — Y998 Other external cause status: Secondary | ICD-10-CM | POA: Insufficient documentation

## 2018-09-12 DIAGNOSIS — S81831A Puncture wound without foreign body, right lower leg, initial encounter: Secondary | ICD-10-CM | POA: Insufficient documentation

## 2018-09-12 DIAGNOSIS — Y9355 Activity, bike riding: Secondary | ICD-10-CM | POA: Insufficient documentation

## 2018-09-12 DIAGNOSIS — Z79899 Other long term (current) drug therapy: Secondary | ICD-10-CM | POA: Diagnosis not present

## 2018-09-12 MED ORDER — LIDOCAINE-EPINEPHRINE (PF) 2 %-1:200000 IJ SOLN
10.0000 mL | Freq: Once | INTRAMUSCULAR | Status: AC
  Administered 2018-09-12: 10 mL via INTRADERMAL
  Filled 2018-09-12: qty 20

## 2018-09-12 MED ORDER — TETANUS-DIPHTH-ACELL PERTUSSIS 5-2.5-18.5 LF-MCG/0.5 IM SUSP
0.5000 mL | Freq: Once | INTRAMUSCULAR | Status: AC
  Administered 2018-09-12: 0.5 mL via INTRAMUSCULAR
  Filled 2018-09-12: qty 0.5

## 2018-09-12 MED ORDER — AMOXICILLIN-POT CLAVULANATE 875-125 MG PO TABS: 1.0000 | ORAL_TABLET | Freq: Two times a day (BID) | ORAL | 0 refills | Status: AC
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
## 2018-09-12 NOTE — Discharge Instructions (Addendum)
You were given a prescription for antibiotics. Please take the antibiotic prescription fully.  ° °Please follow up with your primary care provider within 5-7 days for re-evaluation of your symptoms.  ° °Please return to the emergency room immediately if you experience any new or worsening symptoms or any symptoms that indicate worsening infection such as fevers, increased redness/swelling/pain, warmth, or drainage from the affected area.  ° ° °

## 2018-09-12 NOTE — ED Notes (Signed)
Patient verbalizes understanding of discharge instructions. Opportunity for questioning and answers were provided. Armband removed by staff, pt discharged from ED.  
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
